# Patient Record
Sex: Male | Born: 1985 | Race: White | Hispanic: No | Marital: Single | Smoking: Current every day smoker
Health system: Southern US, Community
[De-identification: ages and names within clinical notes are randomized; demographics above are authoritative.]

---
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
## 2007-02-16 ENCOUNTER — Emergency Department (HOSPITAL_COMMUNITY): Admission: EM | Admit: 2007-02-16 | Discharge: 2007-02-16 | Payer: Self-pay | Admitting: *Deleted

## 2007-06-16 ENCOUNTER — Emergency Department (HOSPITAL_COMMUNITY): Admission: EM | Admit: 2007-06-16 | Discharge: 2007-06-17 | Payer: Self-pay | Admitting: Emergency Medicine
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
## 2009-10-16 ENCOUNTER — Emergency Department (HOSPITAL_BASED_OUTPATIENT_CLINIC_OR_DEPARTMENT_OTHER): Admission: EM | Admit: 2009-10-16 | Discharge: 2009-10-16 | Payer: Self-pay | Admitting: Emergency Medicine

## 2011-01-14 LAB — RAPID URINE DRUG SCREEN, HOSP PERFORMED
Amphetamines: NOT DETECTED
Barbiturates: NOT DETECTED

## 2016-05-30 ENCOUNTER — Encounter (HOSPITAL_COMMUNITY): Payer: Self-pay | Admitting: Emergency Medicine

## 2016-05-30 ENCOUNTER — Emergency Department (HOSPITAL_COMMUNITY)
Admission: EM | Admit: 2016-05-30 | Discharge: 2016-05-30 | Disposition: A | Discharge status: Home / Self Care | Payer: Self-pay | Attending: Emergency Medicine | Admitting: Emergency Medicine

## 2016-05-30 DIAGNOSIS — F17210 Nicotine dependence, cigarettes, uncomplicated: Secondary | ICD-10-CM | POA: Insufficient documentation

## 2016-05-30 DIAGNOSIS — R21 Rash and other nonspecific skin eruption: Secondary | ICD-10-CM

## 2016-05-30 MED ORDER — PREDNISONE 20 MG PO TABS: 40.0000 mg | ORAL_TABLET | Freq: Every day | ORAL | 0 refills | Status: DC

## 2016-05-30 NOTE — ED Provider Notes (Signed)
[facility]-EMERGENCY DEPT Provider Note   CSN: [id number] Arrival date & time: 05/30/16  1953   By signing my name below, I, Clovis Pu, attest that this documentation has been prepared under the direction and in the presence of  Terance Hart, PA-C. Electronically Signed: Clovis Pu, ED Scribe. 05/30/16. 9:12 PM.   History   Chief Complaint Chief Complaint  Patient presents with  . Rash   The history is provided by the patient. No language interpreter was used.   HPI Comments:  Gerald Hayes is a 31 y.o. male who presents to the Emergency Department complaining of worsening, daily, intermittent rash with associated itching to his extremities, back and abdomen onset "a couple of months". He has taken an oatmeal bath with mild relief. Pt denies any pain, OTC medication use, contacts with different chemicals, a new diet, new soap use, new laundry detergent use, fevers, SOB, wheezing or any other associated symptoms. No other complaints noted.   History reviewed. No pertinent past medical history.  There are no active problems to display for this patient.   History reviewed. No pertinent surgical history.  Home Medications    Prior to Admission medications   Not on File    Family History History reviewed. No pertinent family history.  Social History Social History  Substance Use Topics  . Smoking status: Current Every Day Smoker    Types: Cigarettes  . Smokeless tobacco: Never Used  . Alcohol use 3.6 oz/week    6 Cans of beer per week     Comment: "a lot"     Allergies   Sulfa antibiotics   Review of Systems Review of Systems  Constitutional: Negative for fever.  Respiratory: Negative for shortness of breath and wheezing.   Skin: Positive for rash.   Physical Exam Updated Vital Signs BP 123/72 (BP Location: Left Arm)   Pulse 62   Temp 98.7 F (37.1 C) (Oral)   Resp 16   Ht 5\' 11"  (1.803 m)   Wt 181 lb 1.6 oz (82.1 kg)   SpO2 100%   BMI 25.26 kg/m    Physical Exam  Constitutional: He is oriented to person, place, and time. He appears well-developed and well-nourished. No distress.  HENT:  Head: Normocephalic and atraumatic.  Eyes: Conjunctivae are normal.  Cardiovascular: Normal rate.   Pulmonary/Chest: Effort normal.  Abdominal: He exhibits no distension.  Neurological: He is alert and oriented to person, place, and time.  Skin: Skin is warm and dry. Rash noted.  Generalized papular, itchy, red rash on torso, arms and back.  Psychiatric: He has a normal mood and affect.  Nursing note and vitals reviewed.  ED Treatments / Results  DIAGNOSTIC STUDIES:  Oxygen Saturation is 100% on RA, normal by my interpretation.    COORDINATION OF CARE:  9:10 PM Discussed treatment plan with pt at bedside and pt agreed to plan.  Labs (all labs ordered are listed, but only abnormal results are displayed) Labs Reviewed - No data to display  EKG  EKG Interpretation None       Radiology No results found.  Procedures Procedures (including critical care time)  Medications Ordered in ED Medications - No data to display   Initial Impression / Assessment and Plan / ED Course  I have reviewed the triage vital signs and the nursing notes.  Pertinent labs & imaging results that were available during my care of the patient were reviewed by me and considered in my medical decision making (see chart for  details).  Patient with nonspecific eruption. No signs of infection. Discharge with prednisone rx. Advise Benadryl and Hydrocortisone cream. Follow up with PCP in 2-3 days.   Final Clinical Impressions(s) / ED Diagnoses   Final diagnoses:  Rash and nonspecific skin eruption    New Prescriptions Discharge Medication List as of 05/30/2016  9:14 PM    START taking these medications   Details  predniSONE (DELTASONE) 20 MG tablet Take 2 tablets (40 mg total) by mouth daily., Starting Fri 05/30/2016, Print       I personally  performed the services described in this documentation, which was scribed in my presence. The recorded information has been reviewed and is accurate.    Bethel BornKelly Marie Jacquilyn Seldon, PA-C 05/30/16 [id number]    Alvira MondayErin Schlossman, MD 05/31/16 [phone]

## 2016-05-30 NOTE — Discharge Instructions (Signed)
Take Benadryl for itching. You can take up to 2 pills at a time Take Steroids once daily Use Hydrocortisone 1% cream to rub on itchy areas

## 2016-05-30 NOTE — ED Triage Notes (Signed)
Pt presents to ED for intermittent rash x 2 months.  States it is very itchy and red splotches come with the itching.  Patient states it was occassional and small areas at first and then is now spreading.  Patient denies any new detergents, states he was moved since it began, has washed everything.

## 2016-07-08 ENCOUNTER — Emergency Department (HOSPITAL_COMMUNITY)
Admission: EM | Admit: 2016-07-08 | Discharge: 2016-07-08 | Disposition: A | Discharge status: Home / Self Care | Payer: 59

## 2016-07-08 NOTE — ED Notes (Signed)
Called for triage x1, no answer 

## 2017-12-18 ENCOUNTER — Emergency Department (HOSPITAL_COMMUNITY)
Admission: EM | Admit: 2017-12-18 | Discharge: 2017-12-18 | Disposition: A | Discharge status: Home / Self Care | Payer: 59 | Attending: Emergency Medicine | Admitting: Emergency Medicine

## 2017-12-18 ENCOUNTER — Encounter (HOSPITAL_COMMUNITY): Payer: Self-pay | Admitting: *Deleted

## 2017-12-18 ENCOUNTER — Other Ambulatory Visit: Payer: Self-pay

## 2017-12-18 DIAGNOSIS — F17210 Nicotine dependence, cigarettes, uncomplicated: Secondary | ICD-10-CM | POA: Insufficient documentation

## 2017-12-18 DIAGNOSIS — F1290 Cannabis use, unspecified, uncomplicated: Secondary | ICD-10-CM | POA: Insufficient documentation

## 2017-12-18 DIAGNOSIS — T148XXA Other injury of unspecified body region, initial encounter: Secondary | ICD-10-CM

## 2017-12-18 DIAGNOSIS — W2210XD Striking against or struck by unspecified automobile airbag, subsequent encounter: Secondary | ICD-10-CM | POA: Insufficient documentation

## 2017-12-18 DIAGNOSIS — S60511D Abrasion of right hand, subsequent encounter: Secondary | ICD-10-CM | POA: Insufficient documentation

## 2017-12-18 MED ORDER — BACITRACIN ZINC 500 UNIT/GM EX OINT
1.0000 "application " | TOPICAL_OINTMENT | Freq: Two times a day (BID) | CUTANEOUS | Status: DC
  Administered 2017-12-18: 1 via TOPICAL
  Filled 2017-12-18: qty 0.9
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
## 2017-12-18 NOTE — Discharge Instructions (Addendum)
Apply bacitracin or neosporin to the wound. Keep it covered in the daytime.  Let it breathe at night.

## 2017-12-18 NOTE — ED Notes (Signed)
Bacitracin applied to left hand , Telfa pad applied and wrapped with kling.
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
## 2024-01-15 ENCOUNTER — Ambulatory Visit
Admission: EM | Admit: 2024-01-15 | Discharge: 2024-01-15 | Disposition: A | Discharge status: Home / Self Care | Attending: Internal Medicine | Admitting: Internal Medicine

## 2024-01-15 ENCOUNTER — Encounter: Payer: Self-pay | Admitting: Emergency Medicine

## 2024-01-15 DIAGNOSIS — R220 Localized swelling, mass and lump, head: Secondary | ICD-10-CM

## 2024-01-15 DIAGNOSIS — L509 Urticaria, unspecified: Secondary | ICD-10-CM

## 2024-01-15 DIAGNOSIS — T783XXA Angioneurotic edema, initial encounter: Secondary | ICD-10-CM

## 2024-01-15 MED ORDER — CETIRIZINE HCL 10 MG PO TABS: 10.0000 mg | ORAL_TABLET | Freq: Every day | ORAL | 0 refills | Status: AC

## 2024-01-15 MED ORDER — METHYLPREDNISOLONE SODIUM SUCC 125 MG IJ SOLR: 80.0000 mg | Freq: Once | INTRAMUSCULAR | Status: DC

## 2024-01-15 MED ORDER — PREDNISONE 20 MG PO TABS: 40.0000 mg | ORAL_TABLET | Freq: Every day | ORAL | 0 refills | Status: AC

## 2024-01-15 MED ORDER — FAMOTIDINE 20 MG PO TABS: 20.0000 mg | ORAL_TABLET | Freq: Every day | ORAL | 0 refills | Status: AC

## 2024-01-15 NOTE — ED Triage Notes (Addendum)
 Pt presents due to lip swelling that began this morning. Swelling in lips has went down some but swelling has moved up to cheek.  Denies any changes in food or products. Denies difficulty breathing or throat swelling  States he has had random hives in last few weeks that come and go.

## 2024-01-15 NOTE — ED Provider Notes (Signed)
 [facility]    CSN: [id number] Arrival date & time: 01/15/24  1406      History   Chief Complaint Chief Complaint  Patient presents with   Allergic Reaction   Oral Swelling    lips    HPI Gerald Hayes is a 38 y.o. male.   Gerald Hayes is a 38 y.o. male presenting for chief complaint of Allergic Reaction and Oral Swelling (lips) that started this morning. He woke up with angioedema without tongue/throat swelling. He has had normal voice sounds and has been able to maintain oral secretions without difficulty since onset of lip swelling.   Reports random eruption of hives to the body over the last 3-4 days to the abdomen, chest, back, and neck. He does not currently have hives/rash to the body. Angioedema has improved significantly without intervention in the last few hours since he woke up and noticed swelling.   No recent new foods, medications, exposure to environmental allergens, ace inhibitor use, or other known exposures to new personal hygiene products, bedding, cologne, etc.  Allergic to sulfa medications, otherwise no known allergies.   Denies shortness of breath, chest tightness, fever/chills, dizziness, sore throat, and recent steroid use.   He has not attempted treatment of symptoms at home.      History reviewed. No pertinent past medical history.  There are no active problems to display for this patient.   History reviewed. No pertinent surgical history.     Home Medications    Prior to Admission medications   Medication Sig Start Date End Date Taking? Authorizing Provider  cetirizine (ZYRTEC) 10 MG tablet Take 1 tablet (10 mg total) by mouth daily for 7 days. 01/15/24 01/22/24 Yes Enedelia Dorna HERO, FNP  famotidine (PEPCID) 20 MG tablet Take 1 tablet (20 mg total) by mouth daily for 7 days. 01/15/24 01/22/24 Yes StanhopeDorna HERO, FNP  predniSONE  (DELTASONE ) 20 MG tablet Take 2 tablets (40 mg total) by mouth daily with  breakfast for 5 days. 01/15/24 01/20/24 Yes StanhopeDorna HERO, FNP    Family History History reviewed. No pertinent family history.  Social History Social History   Tobacco Use   Smoking status: Every Day    Types: Cigarettes   Smokeless tobacco: Never  Substance Use Topics   Alcohol use: Yes    Alcohol/week: 6.0 standard drinks of alcohol    Types: 6 Cans of beer per week    Comment: a lot   Drug use: Yes    Types: Marijuana     Allergies   Sulfa antibiotics   Review of Systems Review of Systems Per HPI  Physical Exam Triage Vital Signs ED Triage Vitals  Encounter Vitals Group     BP 01/15/24 1413 (!) 156/103     Girls Systolic BP Percentile --      Girls Diastolic BP Percentile --      Boys Systolic BP Percentile --      Boys Diastolic BP Percentile --      Pulse Rate 01/15/24 1413 81     Resp 01/15/24 1413 17     Temp 01/15/24 1416 98.1 F (36.7 C)     Temp Source 01/15/24 1413 Oral     SpO2 01/15/24 1413 98 %     Weight --      Height --      Head Circumference --      Peak Flow --      Pain Score --  Pain Loc --      Pain Education --      Exclude from Growth Chart --    No data found.  Updated Vital Signs BP 117/79   Pulse 81   Temp 98.1 F (36.7 C) (Oral)   Resp 17   SpO2 98%   Visual Acuity Right Eye Distance:   Left Eye Distance:   Bilateral Distance:    Right Eye Near:   Left Eye Near:    Bilateral Near:     Physical Exam Vitals and nursing note reviewed.  Constitutional:      Appearance: He is not ill-appearing or toxic-appearing.  HENT:     Head: Normocephalic and atraumatic.     Jaw: There is normal jaw occlusion.     Right Ear: Hearing, tympanic membrane, ear canal and external ear normal.     Left Ear: Hearing, tympanic membrane, ear canal and external ear normal.     Nose: Nose normal.     Mouth/Throat:     Lips: Pink.     Mouth: Mucous membranes are moist. Angioedema present. No injury or oral lesions.      Dentition: Normal dentition. No dental tenderness or dental abscesses.     Tongue: No lesions. Tongue does not deviate from midline.     Palate: No mass and lesions.     Pharynx: Oropharynx is clear. Uvula midline. No pharyngeal swelling, oropharyngeal exudate, posterior oropharyngeal erythema, uvula swelling or postnasal drip.     Tonsils: No tonsillar exudate.     Comments: Mild angioedema and soft tissue swelling spreading into the bilateral maxillary space. Maintaining secretions without difficulty. Tongue does not appear swollen. Airway intact. Visualized posterior oropharynx is patent.  No warmth or erythema to overlying skin of face, fluctuant, non-tender to palpation over area of swelling.  Eyes:     General: Lids are normal. Vision grossly intact. Gaze aligned appropriately.     Extraocular Movements: Extraocular movements intact.     Conjunctiva/sclera: Conjunctivae normal.  Neck:     Trachea: Trachea and phonation normal.  Cardiovascular:     Rate and Rhythm: Normal rate and regular rhythm.     Heart sounds: Normal heart sounds, S1 normal and S2 normal.  Pulmonary:     Effort: Pulmonary effort is normal. No respiratory distress.     Breath sounds: Normal breath sounds and air entry.  Musculoskeletal:     Cervical back: Neck supple.  Lymphadenopathy:     Cervical: No cervical adenopathy.  Skin:    General: Skin is warm and dry.     Capillary Refill: Capillary refill takes less than 2 seconds.     Findings: No rash.     Comments: No rash to the body surface area.   Neurological:     General: No focal deficit present.     Mental Status: He is alert and oriented to person, place, and time. Mental status is at baseline.     Cranial Nerves: No dysarthria or facial asymmetry.  Psychiatric:        Mood and Affect: Mood normal.        Speech: Speech normal.        Behavior: Behavior normal.        Thought Content: Thought content normal.        Judgment: Judgment normal.     Photo from patient's phone from this morning when he woke up   Current swelling at time of urgent care visit  UC Treatments / Results  Labs (all labs ordered are listed, but only abnormal results are displayed) Labs Reviewed - No data to display  EKG   Radiology No results found.  Procedures Procedures (including critical care time)  Medications Ordered in UC Medications - No data to display  Initial Impression / Assessment and Plan / UC Course  I have reviewed the triage vital signs and the nursing notes.  Pertinent labs & imaging results that were available during my care of the patient were reviewed by me and considered in my medical decision making (see chart for details).   1. Angioedema, lip swelling, urticarial rash Angioedema and intermittent urticaria caused by unknown allergen. He does not take ACE inhibitor. No clear cause of allergic reaction identified with history provided. No signs of anaphylaxis, HEENT exam stable, angioedema improved since this morning.  Recommended steroid injection in clinic, patient declined stating he would rather take steroid pills.  Prednisone  burst, antihistamine, and H2 blocker prescribed.   Recommend follow-up with [facility] allergy specialist for further workup to identify potential trigger for rash/angioedema.   Counseled patient on potential for adverse effects with medications prescribed/recommended today, strict ER and return-to-clinic precautions discussed, patient verbalized understanding.    Final Clinical Impressions(s) / UC Diagnoses   Final diagnoses:  Lip swelling  Angioedema, initial encounter  Urticarial rash     Discharge Instructions      You have been evaluated today for an allergic reaction.  Take steroid pills starting today as prescribed (prednisone - 2 pills once daily for 5 days).   You may take an over the counter antihistamine (Claritin or Zyrtec) for the next 5-7 days as well as a  medication called famotidine (Pepcid) over the counter 20mg  daily for 5-7 days to further reduce your symptoms.   Please schedule an appointment with your primary care provider for follow-up and ongoing management. Return if you experience rashes, difficulty breathing or swallowing, lip/mouth/tongue swelling, vomiting, or for any other concerning symptoms. If symptoms are severe, please go to the ER for further workup.    ED Prescriptions     Medication Sig Dispense Auth. Provider   cetirizine (ZYRTEC) 10 MG tablet Take 1 tablet (10 mg total) by mouth daily for 7 days. 7 tablet Enedelia Dorna HERO, FNP   famotidine (PEPCID) 20 MG tablet Take 1 tablet (20 mg total) by mouth daily for 7 days. 7 tablet Enedelia Dorna HERO, FNP   predniSONE  (DELTASONE ) 20 MG tablet Take 2 tablets (40 mg total) by mouth daily with breakfast for 5 days. 10 tablet Enedelia Dorna HERO, FNP      PDMP not reviewed this encounter.   Enedelia Dorna HERO, [place] 01/16/24 2051

## 2024-01-15 NOTE — Discharge Instructions (Signed)
 You have been evaluated today for an allergic reaction.  Take steroid pills starting today as prescribed (prednisone - 2 pills once daily for 5 days).   You may take an over the counter antihistamine (Claritin or Zyrtec ) for the next 5-7 days as well as a medication called famotidine  (Pepcid ) over the counter 20mg  daily for 5-7 days to further reduce your symptoms.   Please schedule an appointment with your primary care provider for follow-up and ongoing management. Return if you experience rashes, difficulty breathing or swallowing, lip/mouth/tongue swelling, vomiting, or for any other concerning symptoms. If symptoms are severe, please go to the ER for further workup.
